# Patient Record
Sex: Female | Born: 1990 | Race: White | Hispanic: No | Marital: Married
Health system: Southern US, Community
[De-identification: ages and names within clinical notes are randomized; demographics above are authoritative.]

## PROBLEM LIST (undated history)

## (undated) ENCOUNTER — Ambulatory Visit: Admission: EM | Payer: BC Managed Care – PPO | Source: Home / Self Care

## (undated) HISTORY — PX: APPENDECTOMY: SHX54

## (undated) HISTORY — PX: ANKLE ARTHROSCOPY: SHX545

---
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
## 2011-12-14 IMAGING — CT CT STONE STUDY
1 of 2 series · 15 of 32 positions shown, 19 images · non-contrast
Comparison: none

REASON FOR EXAM: add on  R side flank pain  dysuria    call report
[id number]  pt had lmp 4 d...
COMMENTS:

[Series 2: soft tissue · axial · 0.76mm/px · z∈[-329,+115]mm · 15 of 162 slices shown, 19 images]
[im 7/162  soft-tissue]
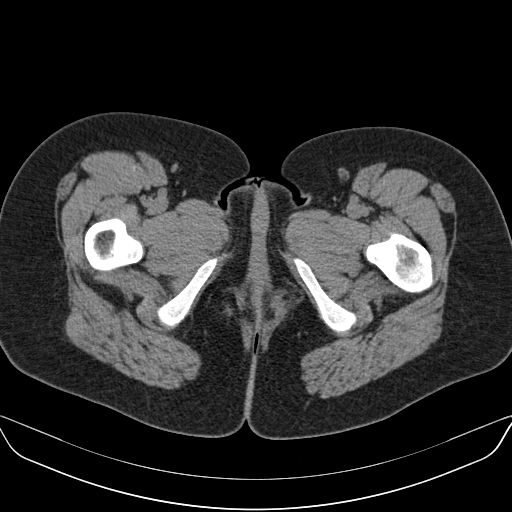
[im 7/162  bone]
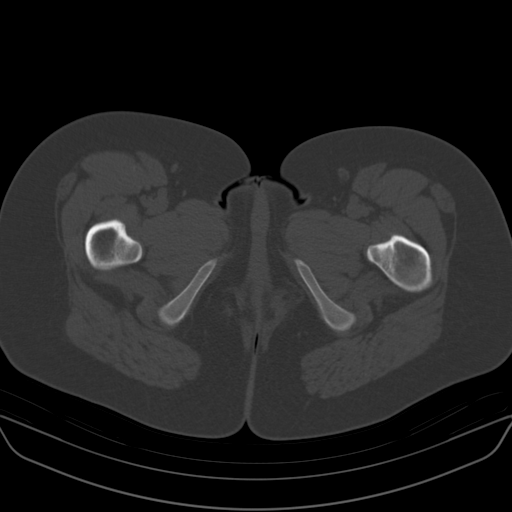
[im 21/162  soft-tissue]
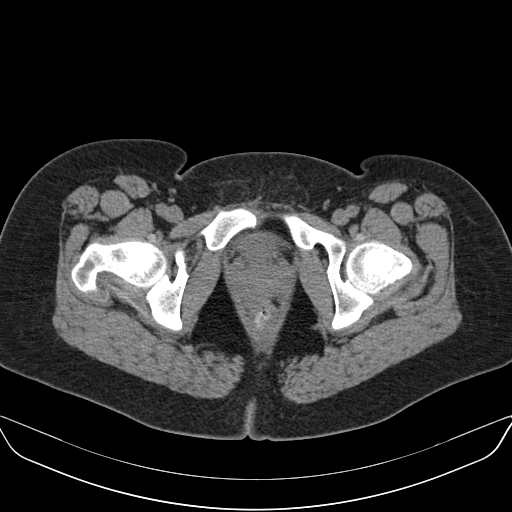
[im 34/162  soft-tissue]
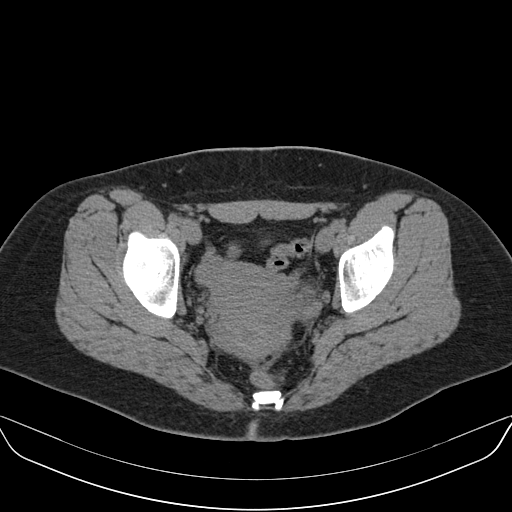
[im 47/162  soft-tissue]
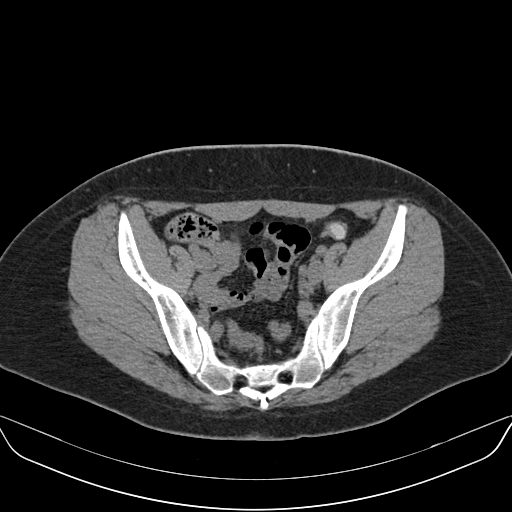
[im 54/162  soft-tissue]
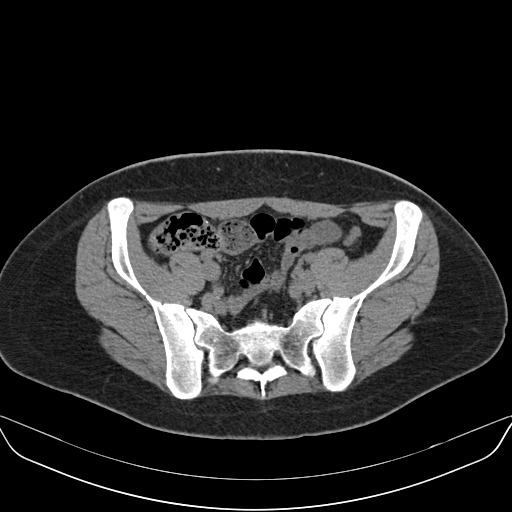
[im 68/162  soft-tissue]
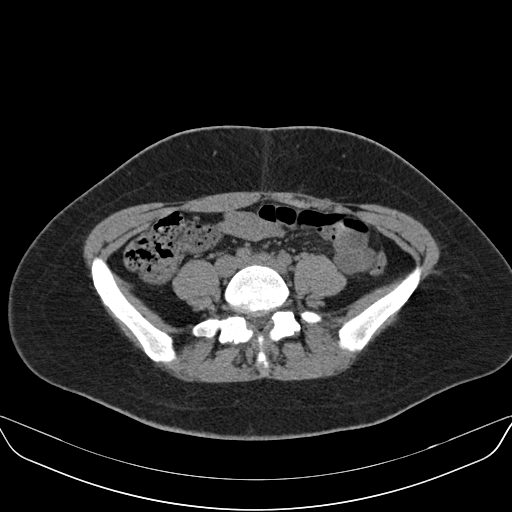
[im 81/162  soft-tissue]
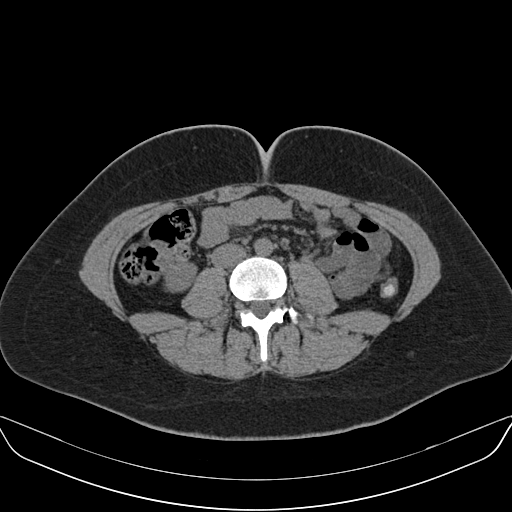
[im 94/162  soft-tissue]
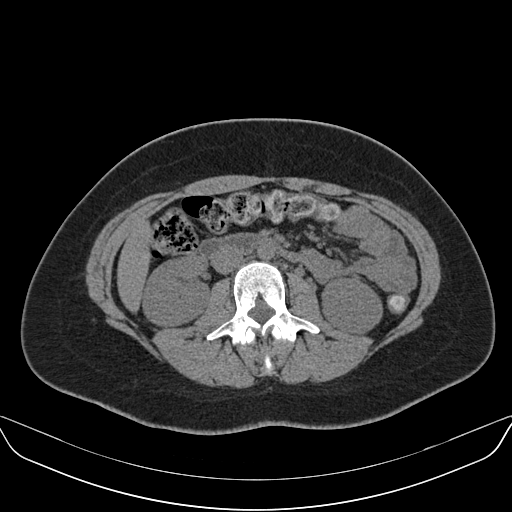
[im 108/162  soft-tissue]
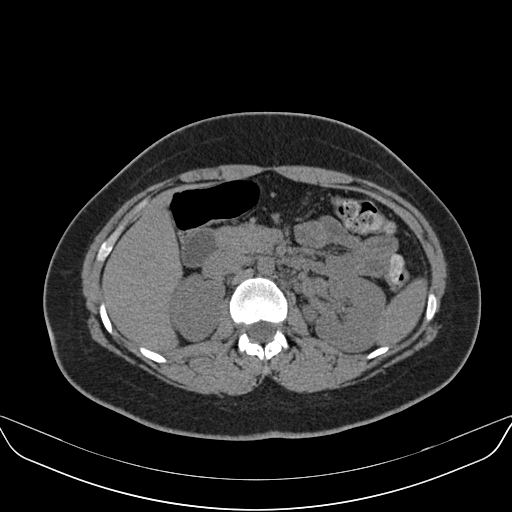
[im 108/162  bone]
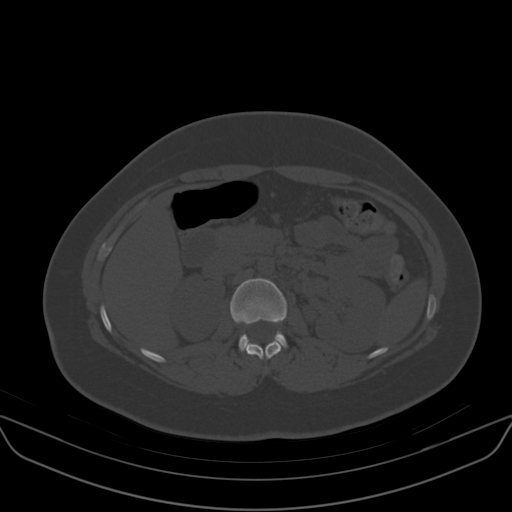
[im 115/162  soft-tissue]
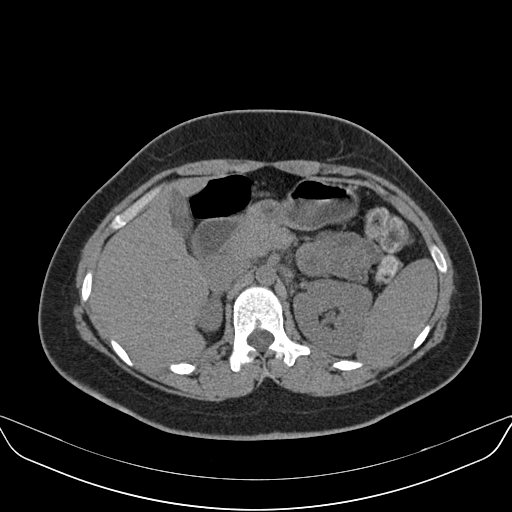
[im 128/162  soft-tissue]
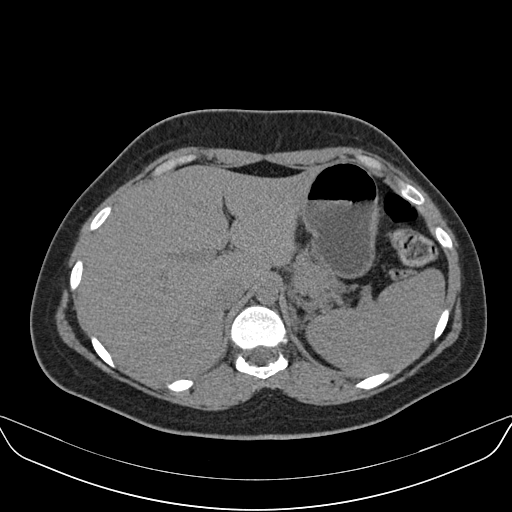
[im 135/162  lung]
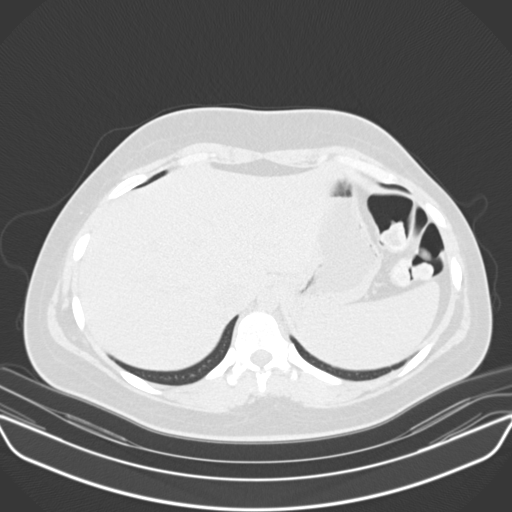
[im 141/162  soft-tissue]
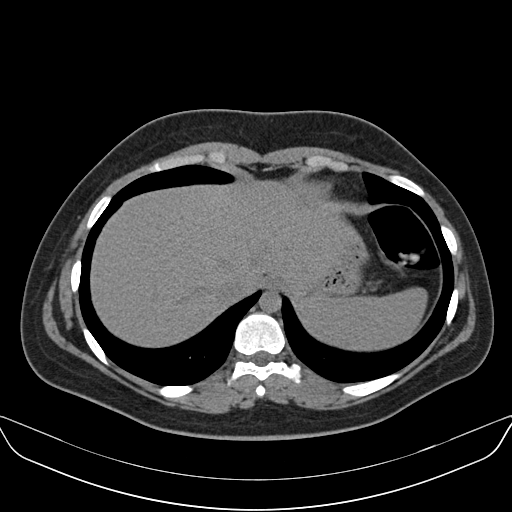
[im 141/162  lung]
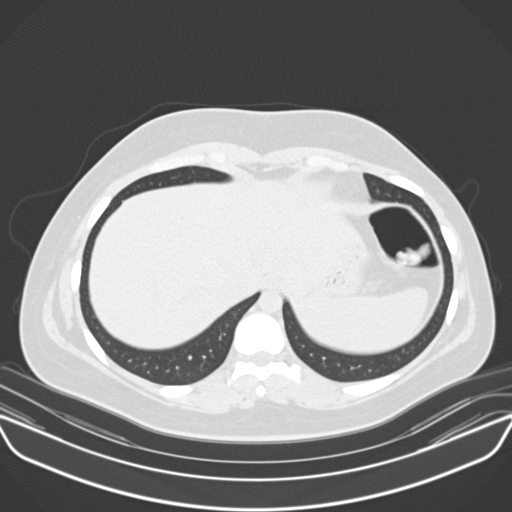
[im 148/162  lung]
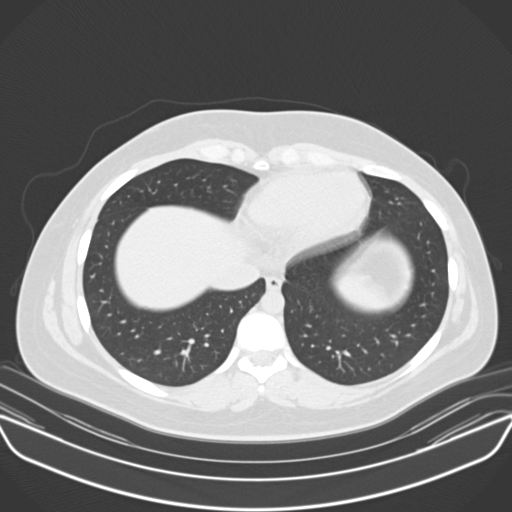
[im 155/162  soft-tissue]
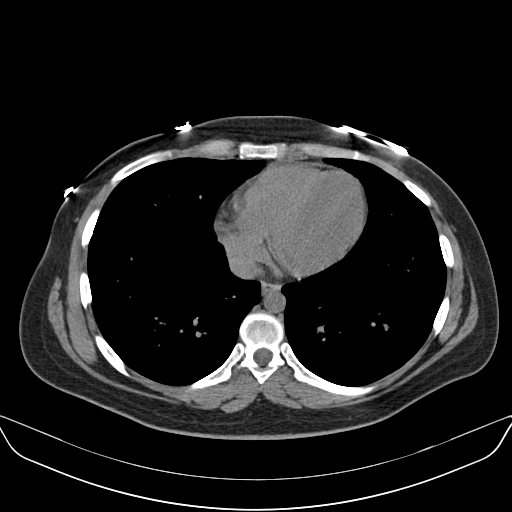
[im 155/162  lung]
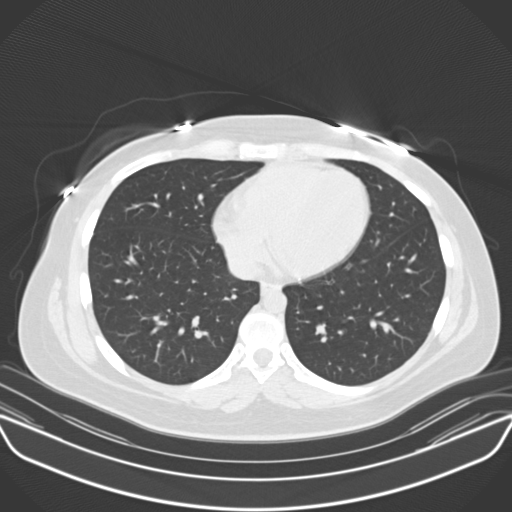

[15 of 32 positions shown; findings below may reference images not displayed]

PROCEDURE:     KAMBOJA - KAMBOJA ABDOMEN/PELVIS WO ( STONE)  - October 23, 2009  [DATE]

RESULT:     Renal stone protocol CT of the abdomen and pelvis is performed
without contrast and reconstructed at 3 mm slice thickness in the axial
plane. The patient has no previous exam for comparison.

There is no evidence of hydronephrosis or hydroureter. No nephrolithiasis is
demonstrated. The urinary bladder has a thickened wall but is not
particularly distended. This could be artifactual or could be secondary to
cystitis. The uterus and adnexal structures appear unremarkable. There is no
free fluid or abscess. The appendix is not definitely identified. The
abdominal viscera otherwise appear to be grossly normal for a noncontrast
exam. The aorta is normal in caliber. There is no evidence of an acute
inflammatory process or abscess. There is no bowel perforation. No
radiopaque gallstones are evident. The lung bases appear clear.
IMPRESSION: 1. No nephrolithiasis.
2. No evidence of hydronephrosis or hydroureter.
3. Nondistended bladder with a thickened wall which may represent cystitis.

## 2014-05-18 ENCOUNTER — Ambulatory Visit: Payer: Self-pay

## 2014-05-18 LAB — RAPID INFLUENZA A&B ANTIGENS

## 2014-05-18 LAB — RAPID STREP-A WITH REFLX: Micro Text Report: NEGATIVE

## 2014-05-21 LAB — BETA STREP CULTURE(ARMC)

## 2015-03-19 ENCOUNTER — Encounter: Payer: Self-pay | Admitting: Dietician

## 2015-03-19 ENCOUNTER — Encounter: Payer: BC Managed Care – PPO | Attending: Family Medicine | Admitting: Dietician

## 2015-03-19 VITALS — Ht 63.5 in | Wt 190.1 lb

## 2015-03-19 DIAGNOSIS — E669 Obesity, unspecified: Secondary | ICD-10-CM | POA: Insufficient documentation

## 2015-03-19 NOTE — Progress Notes (Signed)
Medical Nutrition Therapy: Visit start time: 1030  end time: 1130  Assessment:  Diagnosis: obesity Past medical history: no significant history per patiet Psychosocial issues/ stress concerns: none per patient Preferred learning method:  . Auditory   Current weight: 190.1lbs  Height: 5'3.5" Medications, supplements: birth control pills Progress and evaluation: Patient reports some weight gain after pregnancy 4 years ago especially when taking birth control injections.          Some additional weight gain when taking steroid meds for severe poison ivy.          She reports losing weight on Nutri-system in the past, and has taken Phentermine but had side effects.           She reports significant decrease in sugar-sweetened drinks and leaner meat choices within the past several months.  Physical activity: none   Dietary Intake:  Usual eating pattern includes 3 meals and 0 snacks per day. Dining out frequency: 4 meals per week.  Breakfast: "shakeology" shake Snack: none Lunch: lean cuisine frozen meal Snack: none Supper: usually cooks; fiancee doesn't eat vegetables.  Snack: usually no snacks  Beverages: water, usually only drinks water  Nutrition Care Education: Topics covered: weight managment Basic nutrition: basic food groups, appropriate nutrient balance, appropriate meal and snack schedule, general nutrition guidelines    Weight control: instructed patient on 1400kcal meal plan for weight loss, discussed examples of balanced meals, food portions, strategies to control intake such as using smaller plates   Encouraged high fiber foods, and increased fruit and vegetable intake  Nutritional Diagnosis:  Fellows-3.3 Overweight/obesity As related to history of excess caloric intake, some medication use.  As evidenced by patient report, high BMI.  Intervention: Insruction as noted above.   Set goals for better control of food portions and improved nutritional value.      Education  Materials given:  . Food lists/ Planning A Balanced Meal: 1400kcal meal plan . Sample meal pattern/ menus: Quick and Healthy Meal Ideas . Goals/ instructions   Learner/ who was taught:  . Patient   Level of understanding: Marland Kitchen Verbalizes/ demonstrates competency  Demonstrated degree of understanding via:   Teach back Learning barriers: . None  Willingness to learn/ readiness for change: . Eager, change in progress  Monitoring and Evaluation:  Dietary intake, exercise, and body weight      follow up: 04/29/15

## 2015-03-19 NOTE — Patient Instructions (Signed)
   Measure portions of foods such as meats and starches until you can judge portions without measuring.  Include generous servings of low-carb vegetables, use frozen vegetables to prevent waste or canned for convenience.   Consider adding in some strength-building exercises such as lunges, squats, sit-ups, etc. As you build muscle, you metabolism will increase somewhat.   Plan ahead for balanced meals; have some quick back-up food options at home in case you run out of time.

## 2015-04-29 ENCOUNTER — Ambulatory Visit: Payer: BC Managed Care – PPO | Admitting: Dietician

## 2015-06-06 ENCOUNTER — Encounter: Payer: Self-pay | Admitting: Dietician

## 2015-06-06 NOTE — Progress Notes (Signed)
Have not heard back from patient to reschedule appointment missed on 04/29/15. Sent discharge letter to MD.

## 2020-12-18 ENCOUNTER — Other Ambulatory Visit: Payer: Self-pay

## 2020-12-18 ENCOUNTER — Ambulatory Visit (INDEPENDENT_AMBULATORY_CARE_PROVIDER_SITE_OTHER): Payer: BC Managed Care – PPO | Admitting: Nurse Practitioner

## 2020-12-18 ENCOUNTER — Encounter: Payer: Self-pay | Admitting: Nurse Practitioner

## 2020-12-18 VITALS — BP 112/77 | HR 78 | Temp 98.3°F | Resp 16 | Ht 64.0 in | Wt 143.0 lb

## 2020-12-18 DIAGNOSIS — Z0189 Encounter for other specified special examinations: Secondary | ICD-10-CM

## 2020-12-18 DIAGNOSIS — E559 Vitamin D deficiency, unspecified: Secondary | ICD-10-CM | POA: Diagnosis not present

## 2020-12-18 DIAGNOSIS — Z7689 Persons encountering health services in other specified circumstances: Secondary | ICD-10-CM | POA: Diagnosis not present

## 2020-12-18 NOTE — Progress Notes (Signed)
[facility] [place], [place] [address]  Internal MEDICINE  Office Visit Note  Patient Name: Dana Le  [id number]  [id number]  Date of Service: 12/22/2020   Complaints/HPI Pt is here for establishment of PCP. Chief Complaint  Patient presents with   New Patient (Initial Visit)    Establish care,    HPI Dana Le presents for a new patient visit to establish care. She has no past medical history and her only surgical history is an appendectomy and right ankle arthroscopy. Her family history is significant for cancer, hypertension, and hyperlipidemia. She works as a Animal nutritionist. She lives at home with her husband, daughter, son and her father whom she takes care of. She has a regular diet but she does not drink soda. For exercise, she likes to go swimming and hiking and playing with her children. She is a nonsmoker, she may have 1 alcoholic drink every 6 months and she denies any recreational drug use. She denies any pain.  .  Her pap is due in September 2023.    Current Medication: Outpatient Encounter Medications as of 12/18/2020  Medication Sig Note   TRI-SPRINTEC 0.18/0.215/0.25 MG-35 MCG tablet  03/19/2015: Received from: External Pharmacy   No facility-administered encounter medications on file as of 12/18/2020.    Surgical History: Past Surgical History:  Procedure Laterality Date   ANKLE ARTHROSCOPY Right    APPENDECTOMY      Medical History: History reviewed. No pertinent past medical history.  Family History: Family History  Problem Relation Age of Onset   Hypertension Father    Cancer Father    Stroke Father     Social History   Socioeconomic History   Marital status: Single    Spouse name: Not on file   Number of children: Not on file   Years of education: Not on file   Highest education level: Not on file  Occupational History   Not on file  Tobacco Use   Smoking status: Never   Smokeless tobacco: Not on file   Substance and Sexual Activity   Alcohol use: Yes    Alcohol/week: 0.0 - 1.0 standard drinks    Comment: occ   Drug use: Never   Sexual activity: Not on file  Other Topics Concern   Not on file  Social History Narrative   Not on file   Social Determinants of Health   Financial Resource Strain: Not on file  Food Insecurity: Not on file  Transportation Needs: Not on file  Physical Activity: Not on file  Stress: Not on file  Social Connections: Not on file  Intimate Partner Violence: Not on file     Review of Systems  Constitutional:  Negative for chills, fatigue and unexpected weight change.  HENT:  Negative for congestion, rhinorrhea, sneezing and sore throat.   Eyes:  Negative for redness.  Respiratory:  Negative for cough, chest tightness and shortness of breath.   Cardiovascular:  Negative for chest pain and palpitations.  Gastrointestinal:  Negative for abdominal pain, constipation, diarrhea, nausea and vomiting.  Genitourinary:  Negative for dysuria and frequency.  Musculoskeletal:  Negative for arthralgias, back pain, joint swelling and neck pain.  Skin:  Negative for rash.  Neurological: Negative.  Negative for tremors and numbness.  Hematological:  Negative for adenopathy. Does not bruise/bleed easily.  Psychiatric/Behavioral:  Negative for behavioral problems (Depression), sleep disturbance and suicidal ideas. The patient is not nervous/anxious.    Vital Signs: BP 112/77   Pulse  78   Temp 98.3 F (36.8 C)   Resp 16   Ht 5' 4"  (1.626 m)   Wt 143 lb (64.9 kg)   SpO2 98%   BMI 24.55 kg/m    Physical Exam Vitals reviewed.  Constitutional:      General: She is not in acute distress.    Appearance: Normal appearance. She is normal weight. She is not ill-appearing.  HENT:     Head: Normocephalic and atraumatic.  Cardiovascular:     Rate and Rhythm: Normal rate and regular rhythm.     Pulses: Normal pulses.     Heart sounds: Normal heart sounds. No murmur  heard. Pulmonary:     Effort: Pulmonary effort is normal. No respiratory distress.     Breath sounds: Normal breath sounds.  Skin:    General: Skin is warm and dry.     Capillary Refill: Capillary refill takes less than 2 seconds.  Neurological:     Mental Status: She is alert and oriented to person, place, and time.  Psychiatric:        Mood and Affect: Mood normal.        Behavior: Behavior normal.    Assessment/Plan: 1. Encounter to establish care with new doctor Dana Le is establishing care as a new patient today. She has no current questions or concerns. Discussed age-appropriate screenings: pap smear due September 2023. Annual physical exam due September 2022.   2. Encounter for routine laboratory testing Routine labs ordered to establish baseline. - CBC with Differential/Platelet - CMP14+EGFR - Lipid Profile  3. Vitamin D deficiency Rule out low vitamin D, check vitamin D level.  - Vitamin D (25 hydroxy)    General Counseling: Dana Le verbalizes understanding of the findings of todays visit and agrees with plan of treatment. I have discussed any further diagnostic evaluation that may be needed or ordered today. We also reviewed her medications today. she has been encouraged to call the office with any questions or concerns that should arise related to todays visit.    Counseling:  Dana Le Controlled Substance Database was reviewed by me.  Orders Placed This Encounter  Procedures   CBC with Differential/Platelet   CMP14+EGFR   Lipid Profile   Vitamin D (25 hydroxy)    No orders of the defined types were placed in this encounter.   Return in about 3 months (around 03/20/2021) for CPE, [facility] PCP.  Time spent:30 Minutes  This patient was seen by Dana Osgood, FNP-C in collaboration with Dr. Clayborn Bigness as a part of collaborative care agreement.    [facility] R. Valetta Fuller, MSN, FNP-C Internal Medicine

## 2020-12-24 LAB — CBC WITH DIFFERENTIAL/PLATELET
Basophils Absolute: 0 10*3/uL (ref 0.0–0.2)
Basos: 1 %
EOS (ABSOLUTE): 0.2 10*3/uL (ref 0.0–0.4)
Eos: 3 %
Hematocrit: 38.8 % (ref 34.0–46.6)
Hemoglobin: 12.2 g/dL (ref 11.1–15.9)
Immature Grans (Abs): 0 10*3/uL (ref 0.0–0.1)
Immature Granulocytes: 0 %
Lymphocytes Absolute: 1.9 10*3/uL (ref 0.7–3.1)
Lymphs: 38 %
MCH: 27.5 pg (ref 26.6–33.0)
MCHC: 31.4 g/dL — ABNORMAL LOW (ref 31.5–35.7)
MCV: 88 fL (ref 79–97)
Monocytes Absolute: 0.4 10*3/uL (ref 0.1–0.9)
Monocytes: 7 %
Neutrophils Absolute: 2.5 10*3/uL (ref 1.4–7.0)
Neutrophils: 51 %
Platelets: 301 10*3/uL (ref 150–450)
RBC: 4.43 x10E6/uL (ref 3.77–5.28)
RDW: 12.4 % (ref 11.7–15.4)
WBC: 5 10*3/uL (ref 3.4–10.8)

## 2020-12-24 LAB — CMP14+EGFR
ALT: 16 IU/L (ref 0–32)
AST: 13 IU/L (ref 0–40)
Albumin/Globulin Ratio: 1.9 (ref 1.2–2.2)
Albumin: 4.3 g/dL (ref 3.9–5.0)
Alkaline Phosphatase: 50 IU/L (ref 44–121)
BUN/Creatinine Ratio: 23 (ref 9–23)
BUN: 15 mg/dL (ref 6–20)
Bilirubin Total: 0.4 mg/dL (ref 0.0–1.2)
CO2: 24 mmol/L (ref 20–29)
Calcium: 9 mg/dL (ref 8.7–10.2)
Chloride: 102 mmol/L (ref 96–106)
Creatinine, Ser: 0.64 mg/dL (ref 0.57–1.00)
Globulin, Total: 2.3 g/dL (ref 1.5–4.5)
Glucose: 83 mg/dL (ref 65–99)
Potassium: 4.4 mmol/L (ref 3.5–5.2)
Sodium: 140 mmol/L (ref 134–144)
Total Protein: 6.6 g/dL (ref 6.0–8.5)
eGFR: 123 mL/min/{1.73_m2} (ref >59)

## 2020-12-24 LAB — LIPID PANEL
Chol/HDL Ratio: 2.8 ratio (ref 0.0–4.4)
Cholesterol, Total: 175 mg/dL (ref 100–199)
HDL: 62 mg/dL (ref >39)
LDL Chol Calc (NIH): 96 mg/dL (ref 0–99)
Triglycerides: 92 mg/dL (ref 0–149)
VLDL Cholesterol Cal: 17 mg/dL (ref 5–40)

## 2020-12-24 LAB — VITAMIN D 25 HYDROXY (VIT D DEFICIENCY, FRACTURES): Vit D, 25-Hydroxy: 30.2 ng/mL (ref 30.0–100.0)

## 2020-12-29 ENCOUNTER — Encounter: Payer: Self-pay | Admitting: Nurse Practitioner

## 2021-01-02 ENCOUNTER — Telehealth: Payer: Self-pay

## 2021-01-02 NOTE — Telephone Encounter (Signed)
error 

## 2021-01-20 ENCOUNTER — Ambulatory Visit: Payer: BC Managed Care – PPO | Admitting: Nurse Practitioner

## 2021-03-19 ENCOUNTER — Encounter: Payer: BC Managed Care – PPO | Admitting: Nurse Practitioner

## 2021-03-23 ENCOUNTER — Encounter: Payer: BC Managed Care – PPO | Admitting: Nurse Practitioner

## 2021-03-25 ENCOUNTER — Encounter: Payer: BC Managed Care – PPO | Admitting: Nurse Practitioner

## 2021-06-09 ENCOUNTER — Encounter: Payer: BC Managed Care – PPO | Admitting: Nurse Practitioner

## 2021-07-07 ENCOUNTER — Ambulatory Visit: Payer: Self-pay

## 2021-12-11 ENCOUNTER — Other Ambulatory Visit: Payer: Self-pay | Admitting: Certified Nurse Midwife

## 2021-12-11 DIAGNOSIS — N63 Unspecified lump in unspecified breast: Secondary | ICD-10-CM

## 2021-12-15 ENCOUNTER — Other Ambulatory Visit: Payer: Self-pay | Admitting: Certified Nurse Midwife

## 2021-12-15 DIAGNOSIS — N63 Unspecified lump in unspecified breast: Secondary | ICD-10-CM

## 2021-12-17 ENCOUNTER — Inpatient Hospital Stay
Admission: RE | Admit: 2021-12-17 | Discharge: 2021-12-17 | Disposition: A | Discharge status: Home / Self Care | Payer: Self-pay | Source: Ambulatory Visit | Attending: *Deleted | Admitting: *Deleted

## 2021-12-17 ENCOUNTER — Other Ambulatory Visit: Payer: Self-pay | Admitting: *Deleted

## 2021-12-17 DIAGNOSIS — Z1231 Encounter for screening mammogram for malignant neoplasm of breast: Secondary | ICD-10-CM

## 2021-12-24 ENCOUNTER — Ambulatory Visit
Admission: RE | Admit: 2021-12-24 | Discharge: 2021-12-24 | Disposition: A | Discharge status: Home / Self Care | Payer: BC Managed Care – PPO | Source: Ambulatory Visit | Attending: Certified Nurse Midwife | Admitting: Certified Nurse Midwife

## 2021-12-24 DIAGNOSIS — N63 Unspecified lump in unspecified breast: Secondary | ICD-10-CM | POA: Insufficient documentation

## 2022-01-07 ENCOUNTER — Other Ambulatory Visit: Payer: Medicaid Other

## 2024-03-22 ENCOUNTER — Ambulatory Visit: Admitting: General Surgery

## 2024-03-29 ENCOUNTER — Ambulatory Visit: Admitting: General Surgery

## 2024-04-05 ENCOUNTER — Ambulatory Visit: Admitting: General Surgery

## 2024-04-17 ENCOUNTER — Ambulatory Visit: Admitting: General Surgery

## 2024-05-01 ENCOUNTER — Ambulatory Visit: Admitting: General Surgery

## 2024-05-03 ENCOUNTER — Ambulatory Visit: Admitting: General Surgery
